# Patient Record
Sex: Male | Born: 1985 | Race: White | Hispanic: No | Marital: Married | Smoking: Never smoker
Health system: Southern US, Community
[De-identification: ages and names within clinical notes are randomized; demographics above are authoritative.]

## PROBLEM LIST (undated history)

## (undated) DIAGNOSIS — G4730 Sleep apnea, unspecified: Secondary | ICD-10-CM

## (undated) DIAGNOSIS — F419 Anxiety disorder, unspecified: Secondary | ICD-10-CM

## (undated) DIAGNOSIS — I10 Essential (primary) hypertension: Secondary | ICD-10-CM

## (undated) HISTORY — DX: Sleep apnea, unspecified: G47.30

## (undated) HISTORY — DX: Anxiety disorder, unspecified: F41.9

## (undated) HISTORY — DX: Essential (primary) hypertension: I10

---
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
## 2008-07-19 HISTORY — PX: HERNIA REPAIR: SHX51

## 2009-07-24 ENCOUNTER — Ambulatory Visit: Payer: Self-pay | Admitting: Neurology

## 2010-01-08 ENCOUNTER — Emergency Department: Payer: Self-pay | Admitting: Emergency Medicine

## 2012-07-19 HISTORY — PX: UVULOPALATOPHARYNGOPLASTY: SHX827

## 2013-11-30 ENCOUNTER — Ambulatory Visit: Payer: Self-pay | Admitting: Otolaryngology

## 2013-12-11 ENCOUNTER — Ambulatory Visit: Payer: Self-pay | Admitting: Otolaryngology

## 2014-04-01 ENCOUNTER — Ambulatory Visit: Payer: Self-pay | Admitting: Anesthesiology

## 2014-04-01 DIAGNOSIS — Z01810 Encounter for preprocedural cardiovascular examination: Secondary | ICD-10-CM

## 2014-04-01 DIAGNOSIS — I059 Rheumatic mitral valve disease, unspecified: Secondary | ICD-10-CM

## 2014-04-04 ENCOUNTER — Ambulatory Visit: Payer: Self-pay | Admitting: Otolaryngology

## 2014-04-07 LAB — PATHOLOGY REPORT

## 2014-06-10 ENCOUNTER — Ambulatory Visit: Payer: Self-pay | Admitting: Otolaryngology

## 2014-11-09 NOTE — Op Note (Signed)
PATIENT NAME:  Caleb Jacobson, Caleb Jacobson MR#:  [id number] DATE OF BIRTH:  October 15, 1985  DATE OF PROCEDURE:  04/04/2014  PREOPERATIVE DIAGNOSES: 1.  Obstructive sleep apnea.  2.  Airway obstruction.  3.  Septal deviation.  4.  Turbinate hypertrophy.  5.  Tonsil hypertrophy. 6.  Elongated soft palate and uvula.   POSTOPERATIVE DIAGNOSES: 1.  Obstructive sleep apnea.  2.  Airway obstruction.  3.  Septal deviation.  4.  Turbinate hypertrophy.  5.  Tonsil hypertrophy. 6.  Elongated soft palate and uvula.   OPERATIVE PROCEDURES: 1.  Septoplasty.  2.  Partial reduction of the inferior turbinates.  3.  Uvulopalatopharyngoplasty. 4.  Tonsillectomy.   SURGEON:  Cammy CopaPaul H. Sharbel Sahagun, MD   ANESTHESIA: General.   COMPLICATIONS: None.   TOTAL ESTIMATED BLOOD LOSS:  150 mL.   DESCRIPTION OF PROCEDURE:  The patient was brought to the operating suite and placed in the spine position. He was given general anesthesia by oral endotracheal intubation. The nose was prepped first using 4% Xylocaine mixed with phenylephrine on cotton pledgets in both sides of the nose. The septum was injected with 1% Xylocaine with epinephrine 1:100,000 infiltration in both sides plus the inferior turbinates. He was prepped and draped in sterile fashion.   A left Killian incision was created with elevation of the mucoperichondrium on both sides of the quadrangular plate. There was overhanging cartilage on the left side and some areas where it probably has been fractured that were buckled out. The bony cartilaginous junction was split, and the mucoperiosteum on both sides of the ethmoid and vomer were elevated. The crooked vomer and ethmoid plate areas were trimmed and remaining septum was placed back in the anatomic position. Posteriorly, it was way off to the left side. It was pushed into the middle. There was a small tear inferiorly and posteriorly on the left side. The inferior turbinates, especially on the right side, had to be trimmed  along their anterior-inferior border. Electrocautery was used along the inferior border as well. The remaining portion of the turbinate was outfractured to provide a better airway. The mucosal flaps were then placed back in their anatomic position in the septum, and a 4-0 plain gut suture was used in a through-and-through whipstitch fashion for closure of the nasal septum. This closed the Killian incision as well. Xomed 0.5 mm regular size nasal splints were then trimmed and placed on both sides of the nasal septum; they were held in place with a 3-0 nylon through-and-through suture.   A Davis mouth gag was then used to visualize the oropharynx. The tonsils were markedly enlarged on both sides. The right tonsil was grasped first and pulled medially, the anterior pillar incised. It was dissected to fossa using sharp and blunt dissection. There was plenty of bleeding in the tonsillar fossa that was controlled with electrocautery. The left side tonsil was removed in a similar fashion using electrocautery to control the bleeding.   The UPPP was then done by incising the palate where the posterior tonsillar pillar meets the palate. This was done in a vertical fashion to shorten the palate and pull the tissues laterally. Once this flap was created, a suture was used with 3-0 Vicryl to pull the posterior tonsillar pillar forward and suture it to the anterior tonsillar pillar. This was done on both sides to close the upper portion of the tonsillar fossa and pull the palate forward. The uvula was then trimmed to about one-half of its normal length, and some of it  was trimmed laterally as well. A suture was placed laterally on both sides to help bring the nasopharyngeal mucosa of the palate to the oropharyngeal mucosa. This completed the uvulopalatoplasty with multiple interrupted sutures on both sides.   The patient tolerated the procedure well. Total estimated blood loss was 150 mL. He was awakened and taken to the  recovery room in satisfactory condition. There were no operative complications.    ____________________________ Cammy Copa, MD phj:nb D: 04/04/2014 22:29:48 ET T: 04/04/2014 23:19:14 ET JOB#: [id number]  cc: Cammy Copa, MD, <Dictator> Cammy Copa MD ELECTRONICALLY SIGNED 04/07/2014 9:52

## 2015-01-08 ENCOUNTER — Ambulatory Visit (INDEPENDENT_AMBULATORY_CARE_PROVIDER_SITE_OTHER): Payer: BLUE CROSS/BLUE SHIELD | Admitting: Podiatry

## 2015-01-08 ENCOUNTER — Encounter: Payer: Self-pay | Admitting: Podiatry

## 2015-01-08 ENCOUNTER — Ambulatory Visit (INDEPENDENT_AMBULATORY_CARE_PROVIDER_SITE_OTHER): Payer: BLUE CROSS/BLUE SHIELD

## 2015-01-08 VITALS — Ht 71.0 in | Wt 260.0 lb

## 2015-01-08 DIAGNOSIS — B079 Viral wart, unspecified: Secondary | ICD-10-CM

## 2015-01-08 DIAGNOSIS — M79671 Pain in right foot: Secondary | ICD-10-CM

## 2015-01-08 MED ORDER — FLUOROURACIL 5 % EX CREA: TOPICAL_CREAM | Freq: Two times a day (BID) | CUTANEOUS | Status: DC

## 2015-01-08 NOTE — Progress Notes (Signed)
   Subjective:    Patient ID: Caleb Jacobson, male    DOB: February 16, 1986, 29 y.o.   MRN: [id number] I have a place on my toe it just showed up one day.  Pt has try to cut, shave it off but it has came back. He has no pain , just wants to find out about what it is and take care of it. It looks to be a bunion on the -   1st toe right foot medial side .   HPI    Review of Systems  Constitutional: Positive for appetite change and fatigue.  Respiratory: Positive for apnea.   All other systems reviewed and are negative.      Objective:   Physical Exam: I have reviewed his past medical history medications allergies surgery social history and review of systems. Pulses are strongly palpable bilaterally. Neurologic sensorium is intact per Semmes-Weinstein monofilament. Deep tendon reflexes are intact bilateral. Muscle strength +5 over 5 dorsiflexion plantar flexors and inverters everters all intrinsic musculature is intact. Orthopedic evaluation demonstrates all joints distal to the ankle range of motion without crepitation. Cutaneous evaluation demonstrates supple well-hydrated uterus with a exception of the medial aspect of the IP joint of the hallux right. There is soft lesion measuring less than a centimeter in diameter with thrombosed capillaries centralized. This does appear to be verrucoid in nature. Skin lines do circumvent the lesion. There is 1 small satellite lesion located plantarly.        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  Plan: Chemical distraction of the lesion today with application of Efudex cream to be started within the next couple of days. He will continue with Efudex cream for the next 6 weeks I will follow-up with him at that time. We will consider surgical excision if this does not resolve.

## 2015-02-19 ENCOUNTER — Ambulatory Visit (INDEPENDENT_AMBULATORY_CARE_PROVIDER_SITE_OTHER): Payer: BLUE CROSS/BLUE SHIELD | Admitting: Podiatry

## 2015-02-19 ENCOUNTER — Encounter: Payer: Self-pay | Admitting: Podiatry

## 2015-02-19 DIAGNOSIS — L309 Dermatitis, unspecified: Secondary | ICD-10-CM | POA: Diagnosis not present

## 2015-02-19 DIAGNOSIS — B070 Plantar wart: Secondary | ICD-10-CM

## 2015-02-19 DIAGNOSIS — B079 Viral wart, unspecified: Secondary | ICD-10-CM

## 2015-02-19 MED ORDER — TRIAMCINOLONE ACETONIDE 0.1 % EX CREA: 1.0000 "application " | TOPICAL_CREAM | Freq: Two times a day (BID) | CUTANEOUS | Status: DC

## 2015-02-19 NOTE — Progress Notes (Signed)
He presents today for follow-up of verruca plantaris and medial aspect of the IP joint of the hallux right. He states that it looks like he has decreased thickness him. He continues to use his Efudex cream on a daily basis. He also states that he thinks he got into some poison ivy or pillow is in [place]. He states it seems to be resolving to some degree.  Objective: Vital signs are stable he is alert and oriented 3. Pulses are palpable right. Verruca appears to be resolving. I debrided the verrucoid lesion today to bleeding thrombosed capillaries are visible but are much more superficial. 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  Assessment: Peripheral plantaris and poison ivy.  Plan: Applied acid to the verruca today under occlusion this is to be left on until morning and then washed off thoroughly. I also wrote a prescription for triamcinolone cream for the poison ivy and also suggested Epsom salts and warm water soaks. I will follow-up with him in 6 weeks if necessary.

## 2015-04-02 ENCOUNTER — Encounter: Payer: Self-pay | Admitting: Podiatry

## 2015-04-02 ENCOUNTER — Ambulatory Visit (INDEPENDENT_AMBULATORY_CARE_PROVIDER_SITE_OTHER): Payer: BLUE CROSS/BLUE SHIELD | Admitting: Podiatry

## 2015-04-02 VITALS — BP 123/86 | HR 91 | Resp 16

## 2015-04-02 DIAGNOSIS — B070 Plantar wart: Secondary | ICD-10-CM

## 2015-04-02 DIAGNOSIS — B079 Viral wart, unspecified: Secondary | ICD-10-CM

## 2015-04-02 NOTE — Progress Notes (Signed)
He presents today 6 weeks status post chemical destruction lesion hallux right. He states that it is seems to be doing much better. He continues to Efudex cream on a daily basis. He states that he recently contracted hand foot and mouth disease from his daughter.  Objective: Vital signs are stable alert and oriented 3. Pulses are strongly palpable right foot. He is still sloughing skin from his hands and his feet. He also demonstrates verruca vulgaris well-healed right.  Assessment: Well-healing wart hallux right.  Plan: Debridement of the area today with chemical destruction with acid under occlusion to be left on for 24 hours. He will wash this off thoroughly. He will continue the use of Efudex cream. He will notify me with any questions or concerns. Otherwise he should continue to treat this until completely resolved.

## 2018-02-23 ENCOUNTER — Other Ambulatory Visit: Payer: Self-pay | Admitting: Internal Medicine

## 2018-02-23 DIAGNOSIS — N5089 Other specified disorders of the male genital organs: Secondary | ICD-10-CM

## 2018-02-28 ENCOUNTER — Ambulatory Visit
Admission: RE | Admit: 2018-02-28 | Discharge: 2018-02-28 | Disposition: A | Discharge status: Home / Self Care | Payer: PRIVATE HEALTH INSURANCE | Source: Ambulatory Visit | Attending: Internal Medicine | Admitting: Internal Medicine

## 2018-02-28 DIAGNOSIS — N5089 Other specified disorders of the male genital organs: Secondary | ICD-10-CM | POA: Insufficient documentation

## 2018-02-28 DIAGNOSIS — N433 Hydrocele, unspecified: Secondary | ICD-10-CM | POA: Diagnosis not present

## 2018-02-28 DIAGNOSIS — I861 Scrotal varices: Secondary | ICD-10-CM | POA: Diagnosis not present
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
## 2018-03-09 ENCOUNTER — Encounter: Payer: Self-pay | Admitting: Urology

## 2018-03-09 ENCOUNTER — Ambulatory Visit: Payer: PRIVATE HEALTH INSURANCE | Admitting: Urology

## 2018-03-09 VITALS — BP 120/76 | HR 116 | Ht 71.0 in | Wt 270.0 lb

## 2018-03-09 DIAGNOSIS — I861 Scrotal varices: Secondary | ICD-10-CM | POA: Diagnosis not present

## 2018-03-09 DIAGNOSIS — N433 Hydrocele, unspecified: Secondary | ICD-10-CM

## 2018-06-12 ENCOUNTER — Ambulatory Visit: Payer: PRIVATE HEALTH INSURANCE | Admitting: Urology

## 2019-05-11 ENCOUNTER — Other Ambulatory Visit: Payer: Self-pay

## 2019-05-11 DIAGNOSIS — Z20822 Contact with and (suspected) exposure to covid-19: Secondary | ICD-10-CM

## 2019-05-13 LAB — NOVEL CORONAVIRUS, NAA: SARS-CoV-2, NAA: NOT DETECTED

## 2019-12-18 IMAGING — US US SCROTUM W/ DOPPLER COMPLETE
1 series · 14 of 25 positions shown · non-contrast
Comparison: None.

CLINICAL DATA: Right testicular swelling. Right-sided lump for 4
weeks.

EXAM:
SCROTAL ULTRASOUND
DOPPLER ULTRASOUND OF THE TESTICLES
TECHNIQUE: Complete ultrasound examination of the testicles, epididymis, and
other scrotal structures was performed. Color and spectral Doppler
ultrasound were also utilized to evaluate blood flow to the
testicles.

[Series 1: us scrotum w/ doppler complete · 0.08mm/px · 14 of 71 slices shown]
[im 1/71]
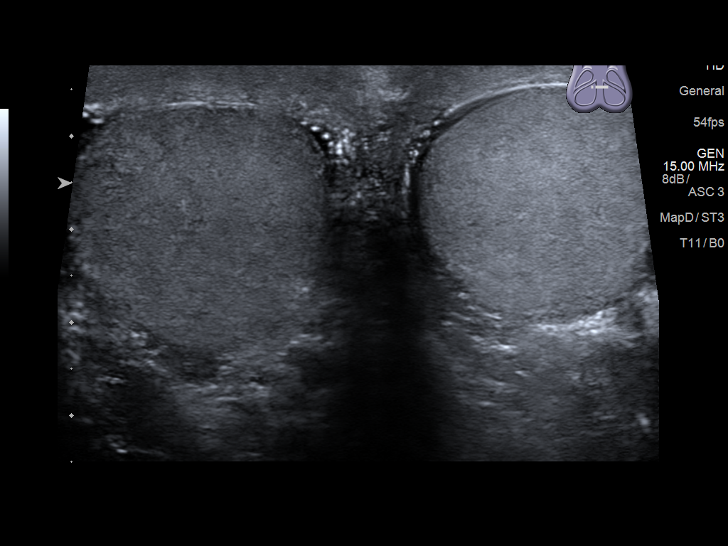
[im 6/71]
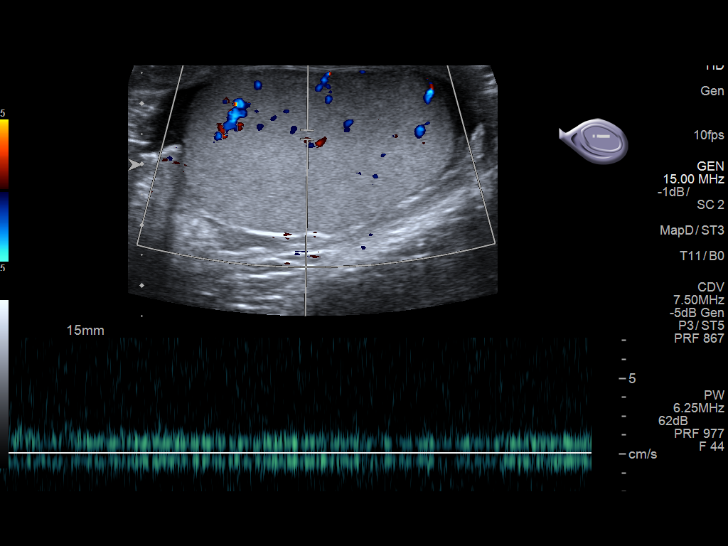
[im 12/71]
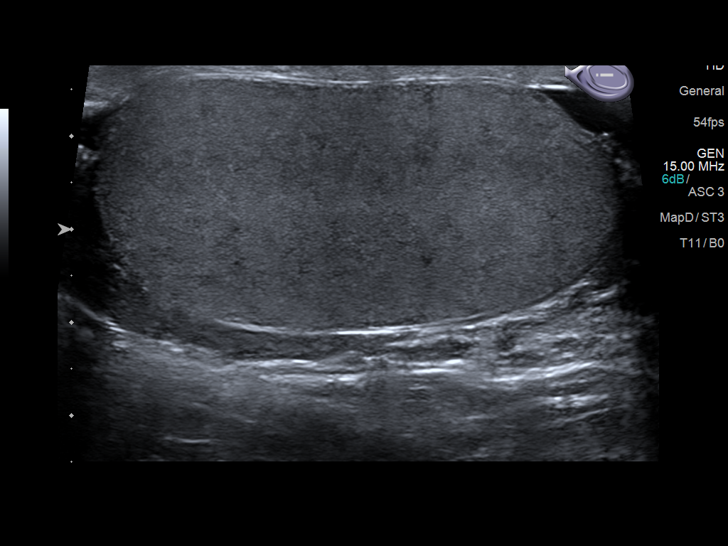
[im 18/71]
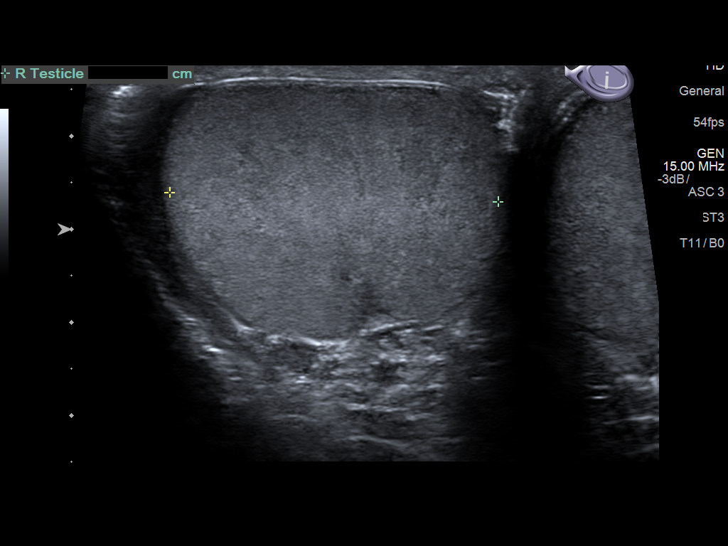
[im 24/71]
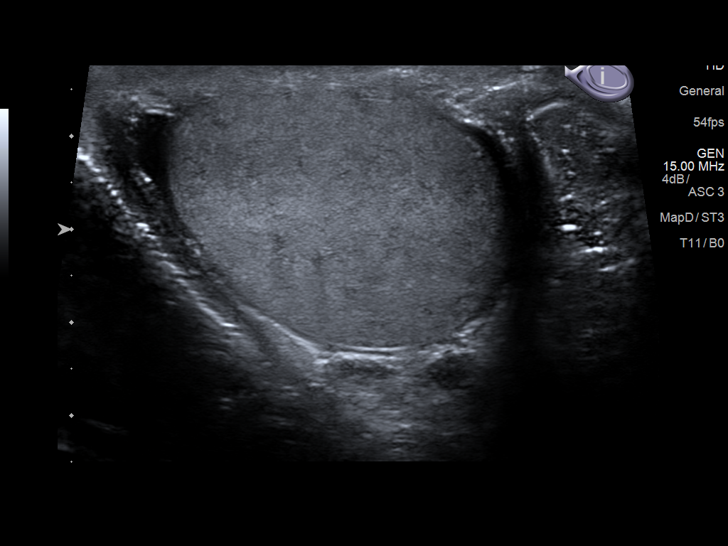
[im 27/71]
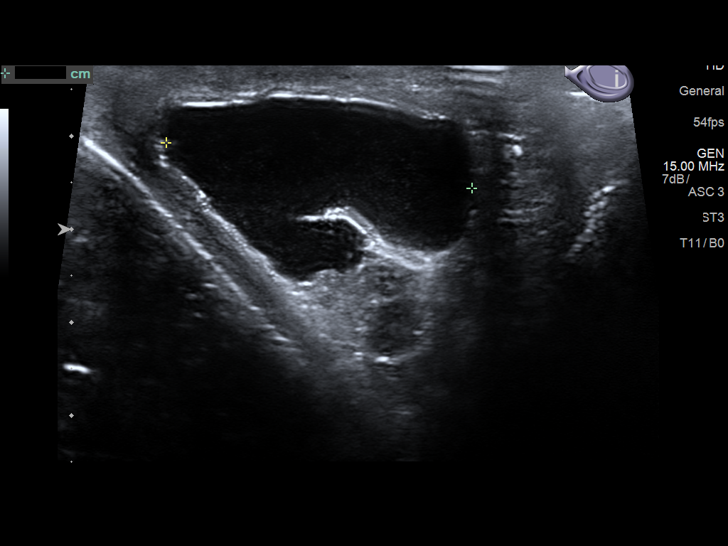
[im 33/71]
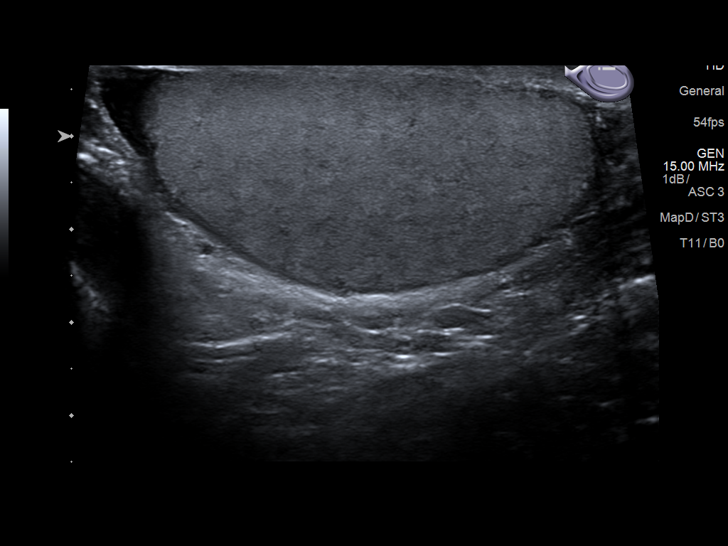
[im 38/71]
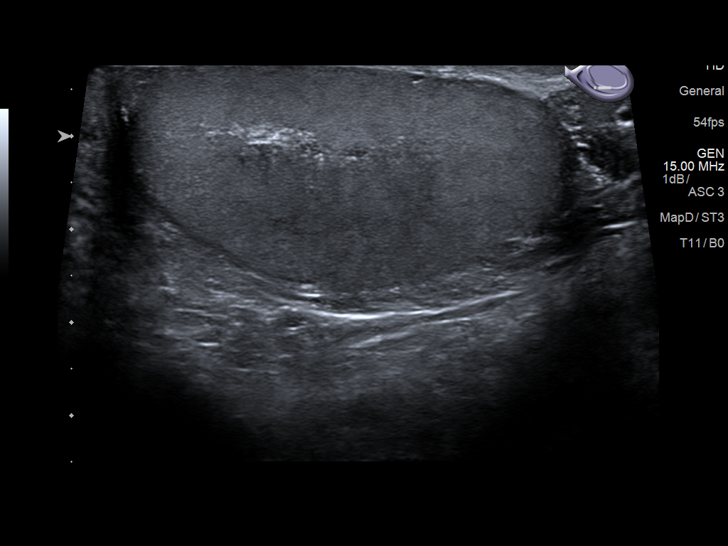
[im 44/71]
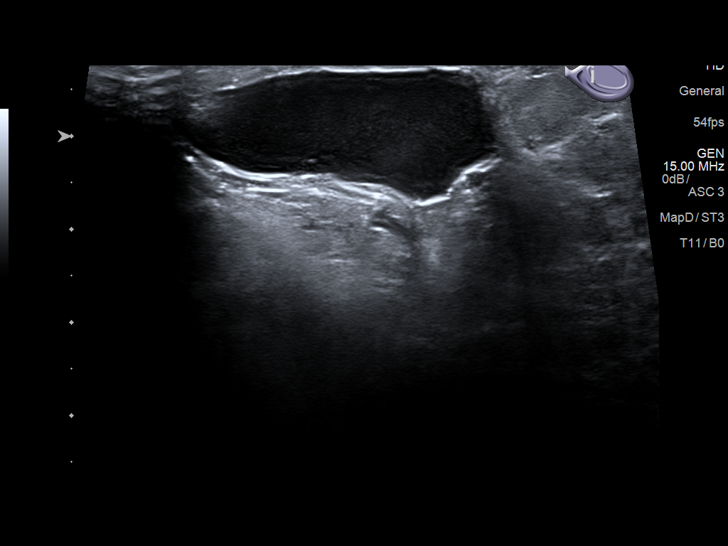
[im 47/71]
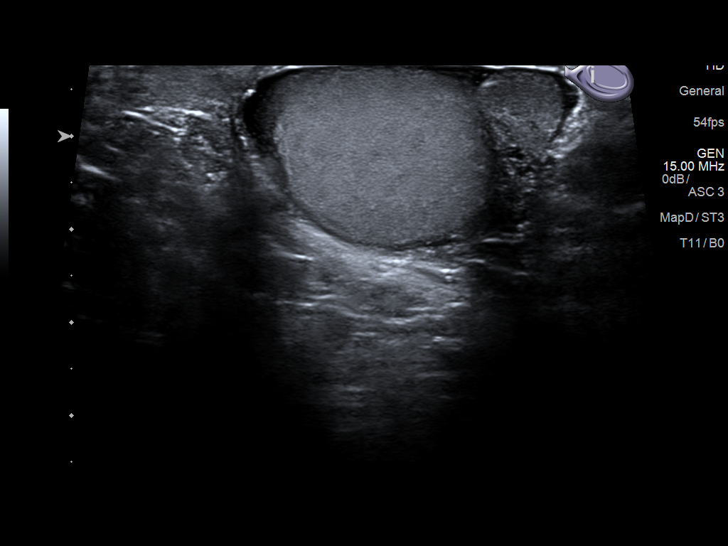
[im 53/71]
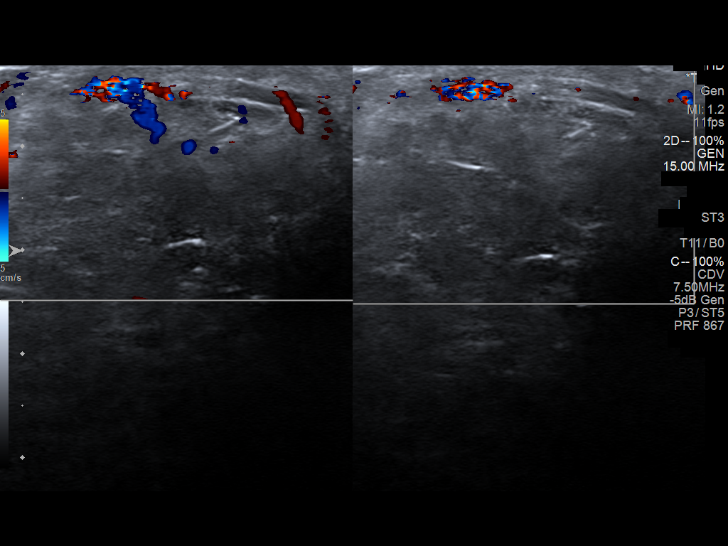
[im 59/71]
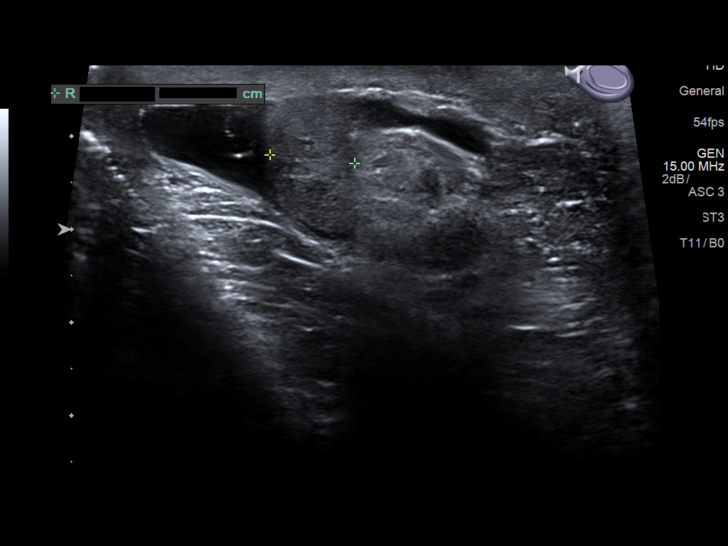
[im 65/71]
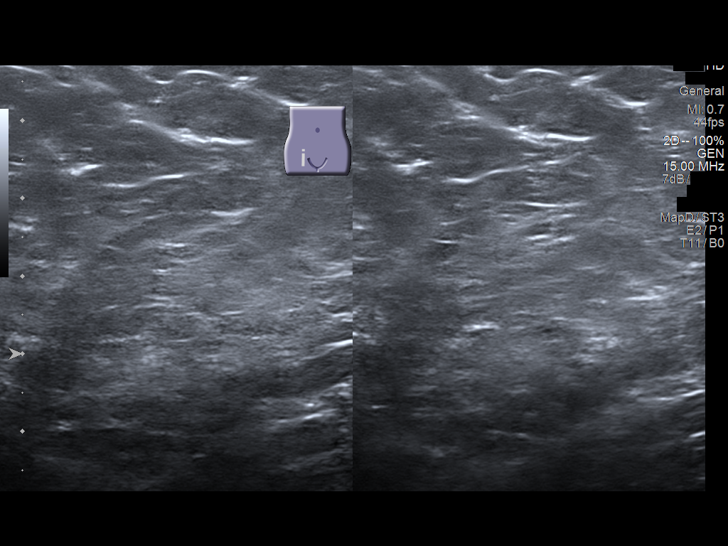
[im 71/71]
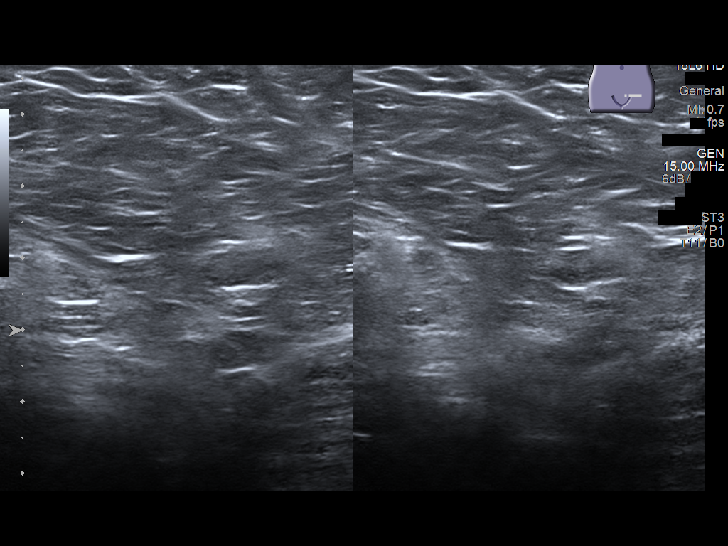

[14 of 25 positions shown; findings below may reference images not displayed]

FINDINGS: Right testicle

Measurements: 5.1 x 2.8 x 3.2 cm. No mass or microlithiasis
visualized.

Left testicle

Measurements: 5.1 x 2.8 x 3.2 cm. No mass or microlithiasis
visualized.

Right epididymis: Normal in size and appearance. There may be a
developing scrotal pearl.

Left epididymis:  Normal in size and appearance.

Hydrocele:  Large bilateral hydroceles, right greater than left.

Varicocele:  Present on the left.

Pulsed Doppler interrogation of both testes demonstrates normal low
resistance arterial and venous waveforms bilaterally.
IMPRESSION: 1. No evidence of testicular torsion or mass.
2. Large bilateral hydroceles, right greater left.
3. Left varicocele.

## 2022-09-10 ENCOUNTER — Encounter: Payer: Self-pay | Admitting: Cardiovascular Disease

## 2022-09-10 ENCOUNTER — Ambulatory Visit (INDEPENDENT_AMBULATORY_CARE_PROVIDER_SITE_OTHER): Payer: BC Managed Care – PPO | Admitting: Cardiovascular Disease

## 2022-09-10 VITALS — BP 132/90 | HR 84 | Ht 71.0 in | Wt 261.0 lb

## 2022-09-10 DIAGNOSIS — I4711 Inappropriate sinus tachycardia, so stated: Secondary | ICD-10-CM

## 2022-09-10 DIAGNOSIS — R0602 Shortness of breath: Secondary | ICD-10-CM | POA: Diagnosis not present

## 2022-09-10 DIAGNOSIS — I340 Nonrheumatic mitral (valve) insufficiency: Secondary | ICD-10-CM

## 2022-09-10 NOTE — Progress Notes (Signed)
Cardiology Office Note   Date:  09/10/2022   ID:  Caleb Jacobson, DOB [birth year], MRN [id number]  PCP:  Jodi Marble, MD  Cardiologist:  Neoma Laming, MD      History of Present Illness: Caleb Jacobson is a 37 y.o. male who presents for  Chief Complaint  Patient presents with   Follow-up    3 Months Follow Up    Shortness of Breath This is a chronic problem. The current episode started more than 1 year ago. The problem has been resolved. The symptoms are aggravated by any activity.      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     HISTORY OF PRESENT ILLNESS  Caleb Jacobson is a 37 year old male.   Has HTN, and sinus tachycardia, SOB with exertion.  Allergy list reviewed   Problem list reviewed   Medication list reviewed    Chest pain or discomfort   Palpitations   Fast heart rate    Dyspnea    Not feeling congested in the chest     CURRENT MEDICATION    Baclofen 10 MG Oral Tablet twice a day, 30 days, 0 refills    busPIRone HCl 15 MG Oral Tablet twice a day, 30 days, 2 refills    Chlorthalidone 25 MG Oral Tablet 1 every morning, 90 days, 0 refills    Valium 5 MG Oral Tablet three times a day, 15 days, 2 refills     PAST MEDICAL/SURGICAL HISTORY  Reported:  Medications:  Taking OTC pain medication. Tests: Blood pressure was checked and an ECG was performed sinus tachycardia 115/min icrbb, lae. Surgical:   Hernia repair  Back surgery  Lumbar disc surgery  2002     SOCIAL HISTORY  Personal:  Recent emotional stress. Tobacco use: Not a current smoker and smoking status: Never smoker. Alcohol: Alcohol.  Not using alcohol. Drug Use: Not using drugs. Habits: Good exercise habits. Work: Occupation Astronomer.     ALLERGIES    No Known Allergies     FAMILY HISTORY  Heart disease breast cancer in family  Systemic hypertension     REVIEW OF SYSTEMS  Systemic: Not feeling poorly (malaise).  No recent weight change. Head: No headache and no sinus pain. Cardiovascular: No chest pain or discomfort, no palpitations, and the heart rate was not fast. Pulmonary: No dyspnea, no cough, and no wheezing. Gastrointestinal: No heartburn.  No nausea, no vomiting, no abdominal pain, and no diarrhea. Neurological: No dizziness, no vertigo, and no fainting. Psychological: No anxiety and no depression.     PHYSICAL FINDINGS    Vitals taken 12/04/2020 02:11 pm  BP-Sitting L  124/76 mmHg 100 - 120/60 - 80  Pulse Rate-Sitting  120 bpm 50 - 100  Temp-Tympanic  98.7 F 96 - 101  Height  71 in 65 - 75  Weight  300 lbs  6.4 oz 125 - 225  Body Mass Index  41.9 kg/m2   Body Surface Area  2.51 m2   Oxygen Saturation  96 % 93 - 100    General Appearance:  In no acute distress. Lungs:  Clear to auscultation. Cardiovascular: Jugular Venous Distention:  JVD not increased.   JVD not increased. Heart Rate And Rhythm:  Normal.   Normal. Heart Sounds:  Normal. Murmurs:  No murmurs were heard. Carotid Arteries:  No bruit in the carotid artery. Arterial Pulses:  Equal bilaterally and normal. Edema:  Not present. Abdomen: Auscultation:  Bowel sounds were normal. Palpation:  Abdominal non-tender. Neurological:  Oriented to time, place, and person.     ASSESSMENT   Shortness of breath  Abnormal electrocardiogram  Sinus tachycardia  Cardiomyopathy  Systemic hypertension  Hyperlipidemia  Type 2 diabetes mellitus  Type 1 diabetes  mellitus     THERAPY   Continue current medication.     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 Will do stress test to see how fast and holter.     HEALTH REMINDERS    Assess BMI satisfied 12/04/2020.    Assess Tobacco Use satisfied 12/04/2020.      SNeoma LamingMD  Electronically signed by: SNeoma Laming    Date: 12/04/2020 14:26 Current Outpatient Medications  Medication Sig Dispense Refill   buPROPion (WELLBUTRIN XL) 150 MG 24 hr tablet Take 150 mg by mouth daily.     buPROPion (WELLBUTRIN XL) 300 MG 24 hr tablet Take 300 mg by mouth daily.     losartan (COZAAR) 25 MG tablet Take 25 mg by mouth daily.     metoprolol succinate (TOPROL-XL) 50 MG 24 hr tablet Take 50 mg by mouth daily.     OZEMPIC, 1 MG/DOSE, 4 MG/3ML SOPN Inject 1 mg into the skin once a week.     valACYclovir (VALTREX) 1000 MG tablet Take 1,000 mg by mouth 3 (three) times daily.     triamcinolone cream (KENALOG) 0.1 % Apply 1 application topically 2 (two) times daily. 30 g 2   No current facility-administered medications for this visit.    Allergies:   Patient has no known allergies.    Social History:   reports that he has never smoked. He has never used smokeless tobacco. He reports current alcohol use. He reports that he does not use drugs.   Family History:  family history is not on file.    ROS:     Review of Systems  Respiratory:  Positive for shortness of breath.       All other systems are reviewed and negative.     PHYSICAL EXAM: VS:  BP (!) 132/90   Pulse 84   Ht '5\' 11"'$  (1.803 m)   Wt 261 lb (118.4 kg)   SpO2 98%   BMI 36.40 kg/m  , BMI Body mass index is 36.4 kg/m. Last weight:  Wt Readings from Last 3 Encounters:  09/10/22 261 lb (118.4 kg)  03/09/18 270 lb (122.5 kg)  01/08/15 260 lb (  117.9 kg)     Physical Exam    EKG:   Recent Labs: No results found for requested labs within last 365 days.    Lipid Panel No results found for: "CHOL", "TRIG", "HDL", "CHOLHDL", "VLDL", "LDLCALC", "LDLDIRECT"    REASON FOR VISIT  Visit for: Echocardiogram/I 47.1  Sex:   Male         wt= 277   lbs.  BP=124/74  Height=71    inches.        TESTS  Imaging: Echocardiogram:  An echocardiogram in (2-d) mode was performed and in Doppler mode with color flow velocity mapping was performed. The aortic valve cusps are abnormal 2.2   cm, flow velocity .99991111  m/s, and systolic calculated mean flow gradient 1   mmHg. Mitral valve diastolic peak flow velocity E .415    m/s and E/A ratio 0.6. Aortic root diameter 3.2  cm. The LVOT internal diameter 3.5  cm and flow velocity was abnormal .707   m/s. LV systolic dimension [id number]   cm, diastolic 4.5  cm, posterior wall thickness 1.29  cm, fractional shortening 40.2 %, and EF 71  %. IVS thickness 1.63  cm. LA dimension 4.4 cm. Mitral Valve has Trace Regurgitation. Tricuspid Valve has Trace Regurgitation.     ASSESSMENT  Technically adequate study.  Normal chamber sizes.  Normal left ventricular systolic function.  Mild left ventricular hypertrophy with GRADE 1 (relaxation abnormality) diastolic dysfunction.  Normal right ventricular systolic function.  Normal right ventricular diastolic function.  Normal left ventricular wall motion.  Normal right ventricular wall motion.  Trace tricuspid regurgitation.  Mild pulmonary hypertension.  Trace mitral regurgitation.  No pericardial effusion.  Mild LVH.     THERAPY   Referring physician:  Dionisio David  Sonographer: Neoma Laming.      Neoma Laming MD  Electronically signed by: Neoma Laming     Date: 01/26/2022 09:06 Other studies Reviewed: Additional studies/ records that were reviewed today include:  Review of the above records demonstrates:       No data to display            ASSESSMENT AND PLAN:    ICD-10-CM   1. SOB (shortness of breath)  R06.02 PCV ECHOCARDIOGRAM COMPLETE    2. Nonrheumatic mitral valve regurgitation  I34.0 PCV ECHOCARDIOGRAM COMPLETE    3. Inappropriate sinus tachycardia  I47.11 PCV ECHOCARDIOGRAM COMPLETE   Lost 60 pounds, feeling better, continue metoprolol. Will check ECHO.       Problem List Items Addressed This Visit   None Visit Diagnoses     SOB (shortness of breath)    -  Primary   Relevant Orders   PCV ECHOCARDIOGRAM COMPLETE   Nonrheumatic mitral valve regurgitation       Relevant Medications   losartan (COZAAR) 25 MG tablet   metoprolol succinate (TOPROL-XL) 50 MG 24 hr tablet   Other Relevant Orders   PCV ECHOCARDIOGRAM COMPLETE   Inappropriate sinus tachycardia       Lost 60 pounds, feeling better, continue metoprolol. Will check ECHO.   Relevant Medications   losartan (COZAAR) 25 MG tablet   metoprolol succinate (TOPROL-XL) 50 MG 24 hr tablet   Other Relevant Orders   PCV ECHOCARDIOGRAM COMPLETE          Disposition:   Return in about 3 months (around 12/09/2022) for echo and f/u.    Total time spent: 30 minutes  Signed,  Neoma Laming, MD  09/10/2022 9:35 AM  [place]

## 2022-09-24 ENCOUNTER — Ambulatory Visit: Payer: BC Managed Care – PPO | Admitting: Internal Medicine

## 2022-09-27 ENCOUNTER — Other Ambulatory Visit: Payer: Self-pay | Admitting: Cardiovascular Disease

## 2022-09-27 DIAGNOSIS — I10 Essential (primary) hypertension: Secondary | ICD-10-CM

## 2022-12-02 ENCOUNTER — Ambulatory Visit (INDEPENDENT_AMBULATORY_CARE_PROVIDER_SITE_OTHER): Payer: BC Managed Care – PPO

## 2022-12-02 DIAGNOSIS — I340 Nonrheumatic mitral (valve) insufficiency: Secondary | ICD-10-CM | POA: Diagnosis not present

## 2022-12-02 DIAGNOSIS — I4711 Inappropriate sinus tachycardia, so stated: Secondary | ICD-10-CM

## 2022-12-02 DIAGNOSIS — R0602 Shortness of breath: Secondary | ICD-10-CM

## 2022-12-09 ENCOUNTER — Encounter: Payer: Self-pay | Admitting: Cardiovascular Disease

## 2022-12-09 ENCOUNTER — Ambulatory Visit (INDEPENDENT_AMBULATORY_CARE_PROVIDER_SITE_OTHER): Payer: BC Managed Care – PPO | Admitting: Cardiovascular Disease

## 2022-12-09 VITALS — BP 130/82 | HR 87 | Ht 71.0 in | Wt 270.0 lb

## 2022-12-09 DIAGNOSIS — I4711 Inappropriate sinus tachycardia, so stated: Secondary | ICD-10-CM | POA: Diagnosis not present

## 2022-12-09 MED ORDER — METOPROLOL SUCCINATE ER 50 MG PO TB24
50.0000 mg | ORAL_TABLET | Freq: Every day | ORAL | 3 refills | Status: DC
Start: 2022-12-09 — End: 2023-04-14

## 2022-12-09 NOTE — Progress Notes (Signed)
Cardiology Office Note   Date:  12/09/2022   ID:  Caleb Jacobson, DOB 03/02/1986, MRN [id number]  PCP:  Sherron Monday, MD  Cardiologist:  Adrian Blackwater, MD      History of Present Illness: Caleb Jacobson is a 37 y.o. male who presents for  Chief Complaint  Patient presents with   Follow-up    3 months follow up & ECHO Results    Patient in office to discuss results of echo. Feeling well.    Past Medical History:  Diagnosis Date   Anxiety    Hypertension    Sleep apnea      Past Surgical History:  Procedure Laterality Date   HERNIA REPAIR  2010   REPLACEMENT [facility] ANTERIOR LUMBAR SPINE  2003   UVULOPALATOPHARYNGOPLASTY  2014     Current Outpatient Medications  Medication Sig Dispense Refill   buPROPion (WELLBUTRIN XL) 300 MG 24 hr tablet Take 300 mg by mouth daily.     losartan (COZAAR) 25 MG tablet TAKE 1 TABLET BY MOUTH EVERY DAY 90 tablet 0   valACYclovir (VALTREX) 1000 MG tablet Take 1,000 mg by mouth 3 (three) times daily.     metoprolol succinate (TOPROL-XL) 50 MG 24 hr tablet Take 1 tablet (50 mg total) by mouth daily. 30 tablet 3   No current facility-administered medications for this visit.    Allergies:   Patient has no known allergies.    Social History:   reports that he has never smoked. He has never used smokeless tobacco. He reports current alcohol use. He reports that he does not use drugs.   Family History:  family history is not on file.    ROS:     Review of Systems  Constitutional: Negative.   HENT: Negative.    Eyes: Negative.   Respiratory: Negative.    Cardiovascular: Negative.   Gastrointestinal: Negative.   Genitourinary: Negative.   Musculoskeletal: Negative.   Skin: Negative.   Neurological: Negative.   Endo/Heme/Allergies: Negative.   Psychiatric/Behavioral: Negative.    All other systems reviewed and are negative.    All other systems are reviewed and negative.    PHYSICAL EXAM: VS:  BP 130/82   Pulse 87    Ht 5\' 11"  (1.803 m)   Wt 270 lb (122.5 kg)   SpO2 96%   BMI 37.66 kg/m  , BMI Body mass index is 37.66 kg/m. Last weight:  Wt Readings from Last 3 Encounters:  12/09/22 270 lb (122.5 kg)  09/10/22 261 lb (118.4 kg)  03/09/18 270 lb (122.5 kg)    Physical Exam Vitals reviewed.  Constitutional:      Appearance: Normal appearance. He is normal weight.  HENT:     Head: Normocephalic.     Nose: Nose normal.     Mouth/Throat:     Mouth: Mucous membranes are moist.  Eyes:     Pupils: Pupils are equal, round, and reactive to light.  Cardiovascular:     Rate and Rhythm: Normal rate and regular rhythm.     Pulses: Normal pulses.     Heart sounds: Normal heart sounds.  Pulmonary:     Effort: Pulmonary effort is normal.  Abdominal:     General: Abdomen is flat. Bowel sounds are normal.  Musculoskeletal:        General: Normal range of motion.     Cervical back: Normal range of motion.  Skin:    General: Skin is warm.  Neurological:  General: No focal deficit present.     Mental Status: He is alert.  Psychiatric:        Mood and Affect: Mood normal.      EKG: none today  Recent Labs: No results found for requested labs within last 365 days.    Lipid Panel No results found for: "CHOL", "TRIG", "HDL", "CHOLHDL", "VLDL", "LDLCALC", "LDLDIRECT"    Other studies Reviewed: echo 11/2022   ASSESSMENT AND PLAN:    ICD-10-CM   1. Inappropriate sinus tachycardia  I47.11 metoprolol succinate (TOPROL-XL) 50 MG 24 hr tablet       Problem List Items Addressed This Visit       Cardiovascular and Mediastinum   Inappropriate sinus tachycardia - Primary    Patient reports feeling well. Recent echo normal EF, grade I DD. HR elevated, recommend increasing metoprolol. Patient states he is taking 25 mg daily, increase to 50 mg daily.       Relevant Medications   metoprolol succinate (TOPROL-XL) 50 MG 24 hr tablet     Disposition:   Return in about 4 months (around  04/11/2023).    Total time spent: 30 minutes  Signed,  Adrian Blackwater, MD  12/09/2022 10:11 AM    [facility]

## 2022-12-09 NOTE — Assessment & Plan Note (Signed)
Patient reports feeling well. Recent echo normal EF, grade I DD. HR elevated, recommend increasing metoprolol. Patient states he is taking 25 mg daily, increase to 50 mg daily.

## 2022-12-09 NOTE — Patient Instructions (Signed)
Check dose of metoprolol. If taking 25 mg, go to 50 mg. If taking 50 mg, let us know.

## 2023-04-13 ENCOUNTER — Other Ambulatory Visit: Payer: Self-pay | Admitting: Cardiovascular Disease

## 2023-04-13 DIAGNOSIS — I4711 Inappropriate sinus tachycardia, so stated: Secondary | ICD-10-CM

## 2023-04-14 ENCOUNTER — Encounter: Payer: Self-pay | Admitting: Cardiovascular Disease

## 2023-04-14 ENCOUNTER — Ambulatory Visit (INDEPENDENT_AMBULATORY_CARE_PROVIDER_SITE_OTHER): Payer: BC Managed Care – PPO | Admitting: Cardiovascular Disease

## 2023-04-14 VITALS — BP 140/95 | HR 107 | Ht 71.0 in | Wt 304.8 lb

## 2023-04-14 DIAGNOSIS — I4711 Inappropriate sinus tachycardia, so stated: Secondary | ICD-10-CM | POA: Diagnosis not present

## 2023-04-14 DIAGNOSIS — I340 Nonrheumatic mitral (valve) insufficiency: Secondary | ICD-10-CM

## 2023-04-14 DIAGNOSIS — I10 Essential (primary) hypertension: Secondary | ICD-10-CM | POA: Diagnosis not present

## 2023-04-14 DIAGNOSIS — R0602 Shortness of breath: Secondary | ICD-10-CM

## 2023-04-14 MED ORDER — SPIRONOLACTONE 25 MG PO TABS: 25.0000 mg | ORAL_TABLET | Freq: Every day | ORAL | 11 refills | Status: DC

## 2023-04-14 NOTE — Progress Notes (Addendum)
Cardiology Office Note   Date:  04/14/2023   ID:  Caleb Jacobson, DOB June 30, 1986, MRN [id number]  PCP:  Sherron Monday, MD  Cardiologist:  Adrian Blackwater, MD      History of Present Illness: Caleb Jacobson is a 37 y.o. male who presents for  Chief Complaint  Patient presents with   Follow-up    4 mo    Doing well, BP is high      Past Medical History:  Diagnosis Date   Anxiety    Hypertension    Sleep apnea      Past Surgical History:  Procedure Laterality Date   HERNIA REPAIR  2010   REPLACEMENT [facility] ANTERIOR LUMBAR SPINE  2003   UVULOPALATOPHARYNGOPLASTY  2014     Current Outpatient Medications  Medication Sig Dispense Refill   lamoTRIgine (LAMICTAL) 25 MG tablet Take 25 mg by mouth 2 (two) times daily.     spironolactone (ALDACTONE) 25 MG tablet Take 1 tablet (25 mg total) by mouth daily. 30 tablet 11   buPROPion (WELLBUTRIN XL) 300 MG 24 hr tablet Take 300 mg by mouth daily.     losartan (COZAAR) 25 MG tablet TAKE 1 TABLET BY MOUTH EVERY DAY 90 tablet 0   metoprolol succinate (TOPROL-XL) 50 MG 24 hr tablet Take 1 tablet (50 mg total) by mouth daily. 30 tablet 3   valACYclovir (VALTREX) 1000 MG tablet Take 1,000 mg by mouth 3 (three) times daily.     No current facility-administered medications for this visit.    Allergies:   Patient has no known allergies.    Social History:   reports that he has never smoked. He has never used smokeless tobacco. He reports current alcohol use. He reports that he does not use drugs.   Family History:  family history is not on file.    ROS:     Review of Systems  Constitutional: Negative.   HENT: Negative.    Eyes: Negative.   Respiratory: Negative.    Gastrointestinal: Negative.   Genitourinary: Negative.   Musculoskeletal: Negative.   Skin: Negative.   Neurological: Negative.   Endo/Heme/Allergies: Negative.   Psychiatric/Behavioral: Negative.    All other systems reviewed and are negative.     All  other systems are reviewed and negative.    PHYSICAL EXAM: VS:  BP (!) 140/95   Pulse (!) 107   Ht 5\' 11"  (1.803 m)   Wt (!) 304 lb 12.8 oz (138.3 kg)   SpO2 95%   BMI 42.51 kg/m  , BMI Body mass index is 42.51 kg/m. Last weight:  Wt Readings from Last 3 Encounters:  04/14/23 (!) 304 lb 12.8 oz (138.3 kg)  12/09/22 270 lb (122.5 kg)  09/10/22 261 lb (118.4 kg)     Physical Exam Vitals reviewed.  Constitutional:      Appearance: Normal appearance. He is normal weight.  HENT:     Head: Normocephalic.     Nose: Nose normal.     Mouth/Throat:     Mouth: Mucous membranes are moist.  Eyes:     Pupils: Pupils are equal, round, and reactive to light.  Cardiovascular:     Rate and Rhythm: Normal rate and regular rhythm.     Pulses: Normal pulses.     Heart sounds: Normal heart sounds.  Pulmonary:     Effort: Pulmonary effort is normal.  Abdominal:     General: Abdomen is flat. Bowel sounds are normal.  Musculoskeletal:  General: Normal range of motion.     Cervical back: Normal range of motion.  Skin:    General: Skin is warm.  Neurological:     General: No focal deficit present.     Mental Status: He is alert.  Psychiatric:        Mood and Affect: Mood normal.       EKG: NSR 96/min INCBB  Recent Labs: No results found for requested labs within last 365 days.    Lipid Panel No results found for: "CHOL", "TRIG", "HDL", "CHOLHDL", "VLDL", "LDLCALC", "LDLDIRECT"    Other studies Reviewed: Additional studies/ records that were reviewed today include:  Review of the above records demonstrates:       No data to display            ASSESSMENT AND PLAN:    ICD-10-CM   1. Inappropriate sinus tachycardia  I47.11 spironolactone (ALDACTONE) 25 MG tablet    2. SOB (shortness of breath)  R06.02 spironolactone (ALDACTONE) 25 MG tablet    3. Nonrheumatic mitral valve regurgitation  I34.0 spironolactone (ALDACTONE) 25 MG tablet    4. Primary  hypertension  I10 spironolactone (ALDACTONE) 25 MG tablet   BP high and has diastolic dysfunction, add aldactone       Problem List Items Addressed This Visit       Cardiovascular and Mediastinum   Inappropriate sinus tachycardia - Primary   Relevant Medications   spironolactone (ALDACTONE) 25 MG tablet   Other Visit Diagnoses     SOB (shortness of breath)       Relevant Medications   spironolactone (ALDACTONE) 25 MG tablet   Nonrheumatic mitral valve regurgitation       Relevant Medications   spironolactone (ALDACTONE) 25 MG tablet   Primary hypertension       BP high and has diastolic dysfunction, add aldactone   Relevant Medications   spironolactone (ALDACTONE) 25 MG tablet          Disposition:   Return in about 6 weeks (around 05/26/2023).    Total time spent: 30 minutes  Signed,  Adrian Blackwater, MD  04/14/2023 9:52 AM    [facility]

## 2023-04-18 ENCOUNTER — Encounter: Payer: Self-pay | Admitting: Cardiovascular Disease

## 2023-04-19 ENCOUNTER — Other Ambulatory Visit: Payer: Self-pay | Admitting: Cardiovascular Disease

## 2023-04-19 DIAGNOSIS — I4711 Inappropriate sinus tachycardia, so stated: Secondary | ICD-10-CM

## 2023-04-25 ENCOUNTER — Ambulatory Visit (INDEPENDENT_AMBULATORY_CARE_PROVIDER_SITE_OTHER): Payer: BC Managed Care – PPO | Admitting: Internal Medicine

## 2023-04-25 ENCOUNTER — Encounter: Payer: Self-pay | Admitting: Internal Medicine

## 2023-04-25 ENCOUNTER — Other Ambulatory Visit: Payer: BC Managed Care – PPO

## 2023-04-25 VITALS — BP 130/86 | HR 102 | Ht 71.0 in | Wt 297.8 lb

## 2023-04-25 DIAGNOSIS — K529 Noninfective gastroenteritis and colitis, unspecified: Secondary | ICD-10-CM

## 2023-04-25 DIAGNOSIS — A09 Infectious gastroenteritis and colitis, unspecified: Secondary | ICD-10-CM

## 2023-04-25 MED ORDER — CIPROFLOXACIN HCL 500 MG PO TABS
500.0000 mg | ORAL_TABLET | Freq: Two times a day (BID) | ORAL | 0 refills | Status: AC
Start: 2023-04-25 — End: 2023-05-02

## 2023-04-25 MED ORDER — METRONIDAZOLE 500 MG PO TABS
500.0000 mg | ORAL_TABLET | Freq: Two times a day (BID) | ORAL | 0 refills | Status: AC
Start: 2023-04-25 — End: 2023-05-02

## 2023-04-25 NOTE — Progress Notes (Signed)
Established Patient Office Visit  Subjective:  Patient ID: Caleb Jacobson, male    DOB: 16-Dec-1985  Age: 37 y.o. MRN: [id number]  Chief Complaint  Patient presents with   Acute Visit    Diarrhea x 7 days    Patient comes in with complaints of loose bowel movements for last 1 week.  There is no history of travel or eating unusual foods.  Other family members and close contacts are not having similar complaints.  Patient reports that he started out with having loose bowel movements on the first day but did not have any abdominal pain or cramps.  Also no reports of nausea or vomiting, and no fever or chills.  Since then he is having 3-4 loose, runny stools and even caused incontinence a few times.  Some days they were not so frequent.  However he has managed to maintain a reasonable appetite and is keeping himself hydrated with water and Gatorade. Denies dizziness or headache no back pain and still no abdominal pain or tenderness. There is no blood or mucus in stools.  There is no family history of inflammatory bowel disease. He is careful about his diet now and has actually lost some weight over the last few days.    No other concerns at this time.   Past Medical History:  Diagnosis Date   Anxiety    Hypertension    Sleep apnea     Past Surgical History:  Procedure Laterality Date   HERNIA REPAIR  2010   REPLACEMENT [facility]C ANTERIOR LUMBAR SPINE  2003   UVULOPALATOPHARYNGOPLASTY  2014    Social History   Socioeconomic History   Marital status: Married    Spouse name: Not on file   Number of children: Not on file   Years of education: Not on file   Highest education level: Not on file  Occupational History   Not on file  Tobacco Use   Smoking status: Never   Smokeless tobacco: Never  Substance and Sexual Activity   Alcohol use: Yes    Alcohol/week: 0.0 standard drinks of alcohol   Drug use: Never   Sexual activity: Not on file  Other Topics Concern   Not on file   Social History Narrative   Not on file   Social Determinants of Health   Financial Resource Strain: Not on file  Food Insecurity: Not on file  Transportation Needs: Not on file  Physical Activity: Not on file  Stress: Not on file  Social Connections: Not on file  Intimate Partner Violence: Not on file    History reviewed. No pertinent family history.  No Known Allergies  Review of Systems  Constitutional:  Positive for weight loss. Negative for chills, diaphoresis, fever and malaise/fatigue.  HENT: Negative.  Negative for sore throat.   Eyes: Negative.   Respiratory: Negative.  Negative for cough and shortness of breath.   Cardiovascular: Negative.  Negative for chest pain, palpitations and leg swelling.  Gastrointestinal:  Positive for diarrhea. Negative for abdominal pain, blood in stool, constipation, heartburn, melena, nausea and vomiting.  Genitourinary: Negative.  Negative for dysuria and flank pain.  Musculoskeletal: Negative.  Negative for joint pain and myalgias.  Skin: Negative.   Neurological: Negative.  Negative for dizziness and headaches.  Endo/Heme/Allergies: Negative.   Psychiatric/Behavioral: Negative.  Negative for depression and suicidal ideas. The patient is not nervous/anxious.        Objective:   BP 130/86   Pulse (!) 102   Ht 5'  11" (1.803 m)   Wt 297 lb 12.8 oz (135.1 kg)   SpO2 99%   BMI 41.53 kg/m   Vitals:   04/25/23 0952  BP: 130/86  Pulse: (!) 102  Height: 5\' 11"  (1.803 m)  Weight: 297 lb 12.8 oz (135.1 kg)  SpO2: 99%  BMI (Calculated): 41.55    Physical Exam Vitals and nursing note reviewed.  Constitutional:      General: He is not in acute distress.    Appearance: Normal appearance.  HENT:     Head: Normocephalic and atraumatic.     Nose: Nose normal.     Mouth/Throat:     Mouth: Mucous membranes are moist.     Pharynx: Oropharynx is clear.  Eyes:     Conjunctiva/sclera: Conjunctivae normal.     Pupils: Pupils are  equal, round, and reactive to light.  Cardiovascular:     Rate and Rhythm: Normal rate and regular rhythm.     Pulses: Normal pulses.     Heart sounds: Normal heart sounds.  Pulmonary:     Effort: Pulmonary effort is normal.     Breath sounds: Normal breath sounds. No wheezing, rhonchi or rales.  Abdominal:     General: Bowel sounds are normal.     Palpations: Abdomen is soft. There is no mass.     Tenderness: There is no abdominal tenderness. There is no right CVA tenderness, left CVA tenderness, guarding or rebound.     Hernia: No hernia is present.  Musculoskeletal:        General: Normal range of motion.     Cervical back: Normal range of motion.     Right lower leg: No edema.     Left lower leg: No edema.  Skin:    General: Skin is warm and dry.     Findings: No rash.  Neurological:     General: No focal deficit present.     Mental Status: He is alert and oriented to person, place, and time.  Psychiatric:        Mood and Affect: Mood normal.        Behavior: Behavior normal.        Judgment: Judgment normal.      No results found for any visits on 04/25/23.  No results found for this or any previous visit (from the past 2160 hour(s)).    Assessment & Plan:  Suspect diarrhea of infectious origin. 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 Will empirically treat with p.o. Cipro and Flagyl for now. Check blood work today. May need stool cultures if not better. Problem List Items Addressed This Visit   None Visit Diagnoses     Gastroenteritis    -  Primary   Relevant Medications   ciprofloxacin (CIPRO) 500 MG tablet   metroNIDAZOLE (FLAGYL) 500 MG tablet   Other Relevant Orders   CBC with Diff   CMP14+EGFR   Lipase   Diarrhea of infectious origin       Relevant Medications   metroNIDAZOLE (FLAGYL) 500 MG tablet       Return in about 1 week (around 05/02/2023).   Total time spent: 30 minutes  Margaretann Loveless, MD  04/25/2023   This document may have  been prepared by [facility] Voice Recognition software and as such may include unintentional dictation errors.

## 2023-04-26 LAB — CBC WITH DIFFERENTIAL/PLATELET
Basophils Absolute: 0.1 10*3/uL (ref 0.0–0.2)
Basos: 1 %
EOS (ABSOLUTE): 0.3 10*3/uL (ref 0.0–0.4)
Eos: 4 %
Hematocrit: 45.1 % (ref 37.5–51.0)
Hemoglobin: 14.2 g/dL (ref 13.0–17.7)
Immature Grans (Abs): 0.1 10*3/uL (ref 0.0–0.1)
Immature Granulocytes: 1 %
Lymphocytes Absolute: 1.5 10*3/uL (ref 0.7–3.1)
Lymphs: 21 %
MCH: 26.2 pg — ABNORMAL LOW (ref 26.6–33.0)
MCHC: 31.5 g/dL (ref 31.5–35.7)
MCV: 83 fL (ref 79–97)
Monocytes Absolute: 0.7 10*3/uL (ref 0.1–0.9)
Monocytes: 10 %
Neutrophils Absolute: 4.4 10*3/uL (ref 1.4–7.0)
Neutrophils: 63 %
Platelets: 250 10*3/uL (ref 150–450)
RBC: 5.43 x10E6/uL (ref 4.14–5.80)
RDW: 13 % (ref 11.6–15.4)
WBC: 6.9 10*3/uL (ref 3.4–10.8)

## 2023-04-26 LAB — CMP14+EGFR
ALT: 18 [IU]/L (ref 0–44)
AST: 22 [IU]/L (ref 0–40)
Albumin: 4.2 g/dL (ref 4.1–5.1)
Alkaline Phosphatase: 75 [IU]/L (ref 44–121)
BUN/Creatinine Ratio: 9 (ref 9–20)
BUN: 12 mg/dL (ref 6–20)
Bilirubin Total: 0.4 mg/dL (ref 0.0–1.2)
CO2: 23 mmol/L (ref 20–29)
Calcium: 9.2 mg/dL (ref 8.7–10.2)
Chloride: 105 mmol/L (ref 96–106)
Creatinine, Ser: 1.41 mg/dL — ABNORMAL HIGH (ref 0.76–1.27)
Globulin, Total: 2.5 g/dL (ref 1.5–4.5)
Glucose: 78 mg/dL (ref 70–99)
Potassium: 4.6 mmol/L (ref 3.5–5.2)
Sodium: 142 mmol/L (ref 134–144)
Total Protein: 6.7 g/dL (ref 6.0–8.5)
eGFR: 65 mL/min/{1.73_m2} (ref >59)

## 2023-04-26 LAB — LIPASE: Lipase: 36 U/L (ref 13–78)

## 2023-04-27 NOTE — Progress Notes (Signed)
Patient notified

## 2023-05-02 ENCOUNTER — Encounter: Payer: Self-pay | Admitting: Internal Medicine

## 2023-05-02 ENCOUNTER — Ambulatory Visit (INDEPENDENT_AMBULATORY_CARE_PROVIDER_SITE_OTHER): Payer: BC Managed Care – PPO | Admitting: Internal Medicine

## 2023-05-02 VITALS — BP 112/78 | HR 96 | Ht 71.0 in | Wt 299.2 lb

## 2023-05-02 DIAGNOSIS — Z0130 Encounter for examination of blood pressure without abnormal findings: Secondary | ICD-10-CM

## 2023-05-02 DIAGNOSIS — A09 Infectious gastroenteritis and colitis, unspecified: Secondary | ICD-10-CM | POA: Diagnosis not present

## 2023-05-02 DIAGNOSIS — F411 Generalized anxiety disorder: Secondary | ICD-10-CM

## 2023-05-02 NOTE — Progress Notes (Signed)
Established Patient Office Visit  Subjective:  Patient ID: Caleb Jacobson, male    DOB: May 15, 1986  Age: 37 y.o. MRN: [id number]  Chief Complaint  Patient presents with   Follow-up    1 week follow up    Patient comes in for follow-up of his diarrhea.  He has completed Cipro and Flagyl and his symptoms have resolved almost completely.  He is not having any loose runny stool but his bowel movement is still soft to loose.  Advised to start taking an OTC probiotic.  No nausea vomiting and no fevers and chills.  He has gained some of the weight back. Needs his regular follow-up with Dr. Margo Aye in one month.    No other concerns at this time.   Past Medical History:  Diagnosis Date   Anxiety    Hypertension    Sleep apnea     Past Surgical History:  Procedure Laterality Date   HERNIA REPAIR  2010   REPLACEMENT DISC ANTERIOR LUMBAR SPINE  2003   UVULOPALATOPHARYNGOPLASTY  2014    Social History   Socioeconomic History   Marital status: Married    Spouse name: Not on file   Number of children: Not on file   Years of education: Not on file   Highest education level: Not on file  Occupational History   Not on file  Tobacco Use   Smoking status: Never   Smokeless tobacco: Never  Substance and Sexual Activity   Alcohol use: Yes    Alcohol/week: 0.0 standard drinks of alcohol   Drug use: Never   Sexual activity: Not on file  Other Topics Concern   Not on file  Social History Narrative   Not on file   Social Determinants of Health   Financial Resource Strain: Not on file  Food Insecurity: Not on file  Transportation Needs: Not on file  Physical Activity: Not on file  Stress: Not on file  Social Connections: Not on file  Intimate Partner Violence: Not on file    History reviewed. No pertinent family history.  No Known Allergies  Review of Systems  Constitutional: Negative.  Negative for chills, diaphoresis, fever, malaise/fatigue and weight loss.  HENT: Negative.     Eyes: Negative.   Respiratory: Negative.  Negative for cough and shortness of breath.   Cardiovascular: Negative.  Negative for chest pain, palpitations and leg swelling.  Gastrointestinal: Negative.  Negative for abdominal pain, blood in stool, constipation, diarrhea, heartburn, melena, nausea and vomiting.  Genitourinary: Negative.  Negative for dysuria and flank pain.  Musculoskeletal: Negative.  Negative for joint pain and myalgias.  Skin: Negative.   Neurological: Negative.  Negative for dizziness and headaches.  Endo/Heme/Allergies: Negative.   Psychiatric/Behavioral: Negative.  Negative for depression and suicidal ideas. The patient is not nervous/anxious.        Objective:   BP 112/78   Pulse 96   Ht 5\' 11"  (1.803 m)   Wt 299 lb 3.2 oz (135.7 kg)   SpO2 96%   BMI 41.73 kg/m   Vitals:   05/02/23 1005  BP: 112/78  Pulse: 96  Height: 5\' 11"  (1.803 m)  Weight: 299 lb 3.2 oz (135.7 kg)  SpO2: 96%  BMI (Calculated): 41.75    Physical Exam Vitals and nursing note reviewed.  Constitutional:      Appearance: Normal appearance.  HENT:     Head: Normocephalic and atraumatic.     Nose: Nose normal.     Mouth/Throat:  Mouth: Mucous membranes are moist.     Pharynx: Oropharynx is clear.  Eyes:     Conjunctiva/sclera: Conjunctivae normal.     Pupils: Pupils are equal, round, and reactive to light.  Cardiovascular:     Rate and Rhythm: Normal rate and regular rhythm.     Pulses: Normal pulses.     Heart sounds: Normal heart sounds.  Pulmonary:     Effort: Pulmonary effort is normal.     Breath sounds: Normal breath sounds.  Abdominal:     General: Bowel sounds are normal.     Palpations: Abdomen is soft.  Musculoskeletal:        General: Normal range of motion.     Cervical back: Normal range of motion.  Skin:    General: Skin is warm and dry.  Neurological:     General: No focal deficit present.     Mental Status: He is alert and oriented to person,  place, and time.  Psychiatric:        Mood and Affect: Mood normal.        Behavior: Behavior normal.        Judgment: Judgment normal.      No results found for any visits on 05/02/23.  Recent Results (from the past 2160 hour(s))  CBC with Diff     Status: Abnormal   Collection Time: 04/25/23 10:16 AM  Result Value Ref Range   WBC 6.9 3.4 - 10.8 x10E3/uL   RBC 5.43 4.14 - 5.80 x10E6/uL   Hemoglobin 14.2 13.0 - 17.7 g/dL   Hematocrit 52.8 41.3 - 51.0 %   MCV 83 79 - 97 fL   MCH 26.2 (L) 26.6 - 33.0 pg   MCHC 31.5 31.5 - 35.7 g/dL   RDW 24.4 01.0 - 27.2 %   Platelets 250 150 - 450 x10E3/uL   Neutrophils 63 Not Estab. %   Lymphs 21 Not Estab. %   Monocytes 10 Not Estab. %   Eos 4 Not Estab. %   Basos 1 Not Estab. %   Neutrophils Absolute 4.4 1.4 - 7.0 x10E3/uL   Lymphocytes Absolute 1.5 0.7 - 3.1 x10E3/uL   Monocytes Absolute 0.7 0.1 - 0.9 x10E3/uL   EOS (ABSOLUTE) 0.3 0.0 - 0.4 x10E3/uL   Basophils Absolute 0.1 0.0 - 0.2 x10E3/uL   Immature Granulocytes 1 Not Estab. %   Immature Grans (Abs) 0.1 0.0 - 0.1 x10E3/uL  CMP14+EGFR     Status: Abnormal   Collection Time: 04/25/23 10:16 AM  Result Value Ref Range   Glucose 78 70 - 99 mg/dL   BUN 12 6 - 20 mg/dL   Creatinine, Ser 5.36 (H) 0.76 - 1.27 mg/dL   eGFR 65 >64 QI/HKV/4.25   BUN/Creatinine Ratio 9 9 - 20   Sodium 142 134 - 144 mmol/L   Potassium 4.6 3.5 - 5.2 mmol/L   Chloride 105 96 - 106 mmol/L   CO2 23 20 - 29 mmol/L   Calcium 9.2 8.7 - 10.2 mg/dL   Total Protein 6.7 6.0 - 8.5 g/dL   Albumin 4.2 4.1 - 5.1 g/dL   Globulin, Total 2.5 1.5 - 4.5 g/dL   Bilirubin Total 0.4 0.0 - 1.2 mg/dL   Alkaline Phosphatase 75 44 - 121 IU/L   AST 22 0 - 40 IU/L   ALT 18 0 - 44 IU/L  Lipase     Status: None   Collection Time: 04/25/23 10:16 AM  Result Value Ref Range   Lipase 36 13 -  78 U/L      Assessment & Plan:  Patient advised to continue drinking enough water.  Monitor his weight.  Follow-up with his PMD. Problem  List Items Addressed This Visit   None Visit Diagnoses     Diarrhea of infectious origin    -  Primary   GAD (generalized anxiety disorder)           Return in about 1 month (around 06/02/2023).   Total time spent: 25 minutes  Margaretann Loveless, MD  05/02/2023   This document may have been prepared by [facility] Voice Recognition software and as such may include unintentional dictation errors.

## 2023-05-26 ENCOUNTER — Encounter: Payer: Self-pay | Admitting: Cardiovascular Disease

## 2023-05-26 ENCOUNTER — Ambulatory Visit (INDEPENDENT_AMBULATORY_CARE_PROVIDER_SITE_OTHER): Payer: BC Managed Care – PPO | Admitting: Cardiovascular Disease

## 2023-05-26 VITALS — BP 118/89 | HR 97 | Ht 71.0 in | Wt 300.0 lb

## 2023-05-26 DIAGNOSIS — I4711 Inappropriate sinus tachycardia, so stated: Secondary | ICD-10-CM

## 2023-05-26 DIAGNOSIS — R0602 Shortness of breath: Secondary | ICD-10-CM

## 2023-05-26 DIAGNOSIS — I340 Nonrheumatic mitral (valve) insufficiency: Secondary | ICD-10-CM

## 2023-05-26 DIAGNOSIS — I10 Essential (primary) hypertension: Secondary | ICD-10-CM | POA: Diagnosis not present

## 2023-05-26 DIAGNOSIS — G4730 Sleep apnea, unspecified: Secondary | ICD-10-CM | POA: Diagnosis not present

## 2023-05-26 NOTE — Progress Notes (Signed)
Cardiology Office Note   Date:  05/26/2023   ID:  Caleb Jacobson, DOB 12/01/1985, MRN [id number]  PCP:  Caleb Monday, MD  Cardiologist:  Caleb Blackwater, MD      History of Present Illness: Caleb Jacobson is a 37 y.o. male who presents for  Chief Complaint  Patient presents with   Follow-up    Doing well      Past Medical History:  Diagnosis Date   Anxiety    Hypertension    Sleep apnea      Past Surgical History:  Procedure Laterality Date   HERNIA REPAIR  2010   REPLACEMENT DISC ANTERIOR LUMBAR SPINE  2003   UVULOPALATOPHARYNGOPLASTY  2014     Current Outpatient Medications  Medication Sig Dispense Refill   buPROPion (WELLBUTRIN XL) 300 MG 24 hr tablet Take 300 mg by mouth daily.     lamoTRIgine (LAMICTAL) 25 MG tablet Take 25 mg by mouth 2 (two) times daily.     losartan (COZAAR) 25 MG tablet TAKE 1 TABLET BY MOUTH EVERY DAY 90 tablet 0   metoprolol succinate (TOPROL-XL) 50 MG 24 hr tablet TAKE 1 TABLET BY MOUTH EVERY DAY. MAKE. FOLLOW UP APPOINTMENT WITH DOCTOR Habib Kise FOR REFILLS** 90 tablet 3   spironolactone (ALDACTONE) 25 MG tablet Take 1 tablet (25 mg total) by mouth daily. 30 tablet 11   No current facility-administered medications for this visit.    Allergies:   Patient has no known allergies.    Social History:   reports that he has never smoked. He has never used smokeless tobacco. He reports current alcohol use. He reports that he does not use drugs.   Family History:  family history is not on file.    ROS:     Review of Systems  Constitutional: Negative.   HENT: Negative.    Eyes: Negative.   Respiratory: Negative.    Gastrointestinal: Negative.   Genitourinary: Negative.   Musculoskeletal: Negative.   Skin: Negative.   Neurological: Negative.   Endo/Heme/Allergies: Negative.   Psychiatric/Behavioral: Negative.    All other systems reviewed and are negative.     All other systems are reviewed and negative.    PHYSICAL  EXAM: VS:  BP 118/89   Pulse 97   Ht 5\' 11"  (1.803 m)   Wt 300 lb (136.1 kg)   SpO2 100%   BMI 41.84 kg/m  , BMI Body mass index is 41.84 kg/m. Last weight:  Wt Readings from Last 3 Encounters:  05/26/23 300 lb (136.1 kg)  05/02/23 299 lb 3.2 oz (135.7 kg)  04/25/23 297 lb 12.8 oz (135.1 kg)     Physical Exam Vitals reviewed.  Constitutional:      Appearance: Normal appearance. He is normal weight.  HENT:     Head: Normocephalic.     Nose: Nose normal.     Mouth/Throat:     Mouth: Mucous membranes are moist.  Eyes:     Pupils: Pupils are equal, round, and reactive to light.  Cardiovascular:     Rate and Rhythm: Normal rate and regular rhythm.     Pulses: Normal pulses.     Heart sounds: Normal heart sounds.  Pulmonary:     Effort: Pulmonary effort is normal.  Abdominal:     General: Abdomen is flat. Bowel sounds are normal.  Musculoskeletal:        General: Normal range of motion.     Cervical back: Normal range of motion.  Skin:  General: Skin is warm.  Neurological:     General: No focal deficit present.     Mental Status: He is alert.  Psychiatric:        Mood and Affect: Mood normal.       EKG:   Recent Labs: 04/25/2023: ALT 18; BUN 12; Creatinine, Ser 1.41; Hemoglobin 14.2; Platelets 250; Potassium 4.6; Sodium 142    Lipid Panel No results found for: "CHOL", "TRIG", "HDL", "CHOLHDL", "VLDL", "LDLCALC", "LDLDIRECT"    Other studies Reviewed: Additional studies/ records that were reviewed today include:  Review of the above records demonstrates:       No data to display            ASSESSMENT AND PLAN:    ICD-10-CM   1. 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 SOB (shortness of breath)  R06.02    Better with aldactone    3. Nonrheumatic mitral valve regurgitation  I34.0     4. Primary hypertension  I10     5. Sleep apnea, unspecified type  G47.30    Non compliant  with cpap       Problem List  Items Addressed This Visit       Cardiovascular and Mediastinum   Inappropriate sinus tachycardia (HCC) - Primary   Other Visit Diagnoses     SOB (shortness of breath)       Better with aldactone   Nonrheumatic mitral valve regurgitation       Primary hypertension       Sleep apnea, unspecified type       Non compliant  with cpap          Disposition:   Return in about 3 months (around 08/26/2023).    Total time spent: 30 minutes  Signed,  Caleb Blackwater, MD  05/26/2023 9:43 AM    [facility]

## 2023-06-07 ENCOUNTER — Encounter: Payer: Self-pay | Admitting: Internal Medicine

## 2023-06-07 ENCOUNTER — Ambulatory Visit (INDEPENDENT_AMBULATORY_CARE_PROVIDER_SITE_OTHER): Payer: BC Managed Care – PPO | Admitting: Internal Medicine

## 2023-06-07 VITALS — BP 135/79 | HR 105 | Ht 71.0 in | Wt 306.2 lb

## 2023-06-07 DIAGNOSIS — F411 Generalized anxiety disorder: Secondary | ICD-10-CM | POA: Insufficient documentation

## 2023-06-07 DIAGNOSIS — I10 Essential (primary) hypertension: Secondary | ICD-10-CM | POA: Diagnosis not present

## 2023-06-07 MED ORDER — WEGOVY 0.25 MG/0.5ML ~~LOC~~ SOAJ: 0.2500 mg | SUBCUTANEOUS | 0 refills | Status: AC

## 2023-06-07 NOTE — Progress Notes (Signed)
Established Patient Office Visit  Subjective:  Patient ID: Caleb Jacobson, male    DOB: 09-23-1985  Age: 37 y.o. MRN: [id number]  Chief Complaint  Patient presents with   Follow-up    No new complaints, here for lab review and medication refills. Diarrhea has resolved.   No other concerns at this time.   Past Medical History:  Diagnosis Date   Anxiety    Hypertension    Sleep apnea     Past Surgical History:  Procedure Laterality Date   HERNIA REPAIR  2010   REPLACEMENT DISC ANTERIOR LUMBAR SPINE  2003   UVULOPALATOPHARYNGOPLASTY  2014    Social History   Socioeconomic History   Marital status: Married    Spouse name: Not on file   Number of children: Not on file   Years of education: Not on file   Highest education level: Not on file  Occupational History   Not on file  Tobacco Use   Smoking status: Never   Smokeless tobacco: Never  Substance and Sexual Activity   Alcohol use: Yes    Alcohol/week: 0.0 standard drinks of alcohol   Drug use: Never   Sexual activity: Not on file  Other Topics Concern   Not on file  Social History Narrative   Not on file   Social Determinants of Health   Financial Resource Strain: Not on file  Food Insecurity: Not on file  Transportation Needs: Not on file  Physical Activity: Not on file  Stress: Not on file  Social Connections: Not on file  Intimate Partner Violence: Not on file    No family history on file.  No Known Allergies  Outpatient Medications Prior to Visit  Medication Sig   buPROPion (WELLBUTRIN XL) 300 MG 24 hr tablet Take 300 mg by mouth daily.   lamoTRIgine (LAMICTAL) 25 MG tablet Take 25 mg by mouth 2 (two) times daily.   losartan (COZAAR) 25 MG tablet TAKE 1 TABLET BY MOUTH EVERY DAY   metoprolol succinate (TOPROL-XL) 50 MG 24 hr tablet TAKE 1 TABLET BY MOUTH EVERY DAY. MAKE. FOLLOW UP APPOINTMENT WITH DOCTOR KHAN FOR REFILLS**   spironolactone (ALDACTONE) 25 MG tablet Take 1 tablet (25 mg total)  by mouth daily.   No facility-administered medications prior to visit.    Review of Systems  Psychiatric/Behavioral:  Positive for depression.        Objective:   BP 135/79   Pulse (!) 105   Ht 5\' 11"  (1.803 m)   Wt (!) 306 lb 3.2 oz (138.9 kg)   SpO2 98%   BMI 42.71 kg/m   Vitals:   06/07/23 1033  BP: 135/79  Pulse: (!) 105  Height: 5\' 11"  (1.803 m)  Weight: (!) 306 lb 3.2 oz (138.9 kg)  SpO2: 98%  BMI (Calculated): 42.73    Physical Exam Vitals reviewed.  Constitutional:      Appearance: Normal appearance. He is obese.  HENT:     Head: Normocephalic.     Left Ear: There is no impacted cerumen.     Nose: Nose normal.     Mouth/Throat:     Mouth: Mucous membranes are moist.     Pharynx: No posterior oropharyngeal erythema.  Eyes:     Extraocular Movements: Extraocular movements intact.     Pupils: Pupils are equal, round, and reactive to light.  Cardiovascular:     Rate and Rhythm: Regular rhythm.     Chest Wall: PMI is not displaced.  Pulses: Normal pulses.     Heart sounds: Normal heart sounds. No murmur heard. Pulmonary:     Effort: Pulmonary effort is normal.     Breath sounds: Normal air entry. No rhonchi or rales.  Abdominal:     General: Abdomen is flat. Bowel sounds are normal. There is no distension.     Palpations: Abdomen is soft. There is no hepatomegaly, splenomegaly or mass.     Tenderness: There is no abdominal tenderness.  Musculoskeletal:        General: Normal range of motion.     Cervical back: Normal range of motion and neck supple.     Right lower leg: No edema.     Left lower leg: No edema.  Skin:    General: Skin is warm and dry.  Neurological:     General: No focal deficit present.     Mental Status: He is alert and oriented to person, place, and time.     Cranial Nerves: No cranial nerve deficit.     Motor: No weakness.  Psychiatric:        Mood and Affect: Mood normal.        Behavior: Behavior normal.      No  results found for any visits on 06/07/23.      Assessment & Plan:  As per problem list  Problem List Items Addressed This Visit       Cardiovascular and Mediastinum   Primary hypertension - Primary     Other   Morbid obesity (HCC)   Relevant Medications   Semaglutide-Weight Management (WEGOVY) 0.25 MG/0.5ML SOAJ   Other Relevant Orders   Comprehensive metabolic panel   Lipid panel   Hemoglobin A1c   CBC With Diff/Platelet   GAD (generalized anxiety disorder)    Return in about 1 month (around 07/07/2023) for Weight management, cpe with labs prior.   Total time spent: 20 minutes  Luna Fuse, MD  06/07/2023   This document may have been prepared by [facility] Voice Recognition software and as such may include unintentional dictation errors.

## 2023-06-08 ENCOUNTER — Telehealth: Payer: Self-pay | Admitting: Internal Medicine

## 2023-06-08 NOTE — Telephone Encounter (Signed)
Error

## 2023-07-18 ENCOUNTER — Encounter: Payer: BC Managed Care – PPO | Admitting: Internal Medicine

## 2023-08-26 ENCOUNTER — Ambulatory Visit: Payer: BC Managed Care – PPO | Admitting: Cardiovascular Disease

## 2023-10-11 ENCOUNTER — Other Ambulatory Visit: Payer: Self-pay | Admitting: Cardiovascular Disease

## 2023-10-11 DIAGNOSIS — I10 Essential (primary) hypertension: Secondary | ICD-10-CM

## 2023-12-02 ENCOUNTER — Encounter: Payer: Self-pay | Admitting: Cardiology

## 2023-12-02 ENCOUNTER — Ambulatory Visit: Payer: PRIVATE HEALTH INSURANCE | Admitting: Cardiology

## 2023-12-02 VITALS — BP 112/64 | HR 109 | Ht 71.0 in | Wt 312.0 lb

## 2023-12-02 DIAGNOSIS — B029 Zoster without complications: Secondary | ICD-10-CM | POA: Insufficient documentation

## 2023-12-02 DIAGNOSIS — Z0130 Encounter for examination of blood pressure without abnormal findings: Secondary | ICD-10-CM

## 2023-12-02 MED ORDER — VALACYCLOVIR HCL 1 G PO TABS: 1000.0000 mg | ORAL_TABLET | Freq: Three times a day (TID) | ORAL | 0 refills | Status: AC

## 2023-12-02 MED ORDER — GABAPENTIN 300 MG PO CAPS: 300.0000 mg | ORAL_CAPSULE | Freq: Every day | ORAL | 1 refills | Status: AC

## 2023-12-02 NOTE — Progress Notes (Signed)
 Established Patient Office Visit  Subjective:  Patient ID: Caleb Jacobson, male    DOB: [birth year]  Age: 38 y.o. MRN: [id number]  Chief Complaint  Patient presents with   Acute Visit    Rash on R Side Forearm into Armpit area    Patient in office for an acute visit, complaining of a rash on his right side forearm into axillary area. Patient reports rash started yesterday. Rash is unilateral, vesicular, in a dermatomal distribution with erythematous base. Rash is painful to touch. Patient had shingles in the past two years. Will send in valaciclovir, and gabapentin for post herpetic pain. Recommend getting a shingles vaccination in 3-4 months from the pharmacy. Patient reports girlfriend is pregnant, recommend isolating from her for at least one week. Girlfriend to reach out to her OB for any further recommendations.     No other concerns at this time.   Past Medical History:  Diagnosis Date   Anxiety    Hypertension    Sleep apnea     Past Surgical History:  Procedure Laterality Date   HERNIA REPAIR  2010   REPLACEMENT DISC ANTERIOR LUMBAR SPINE  2003   UVULOPALATOPHARYNGOPLASTY  2014    Social History   Socioeconomic History   Marital status: Married    Spouse name: Not on file   Number of children: Not on file   Years of education: Not on file   Highest education level: Not on file  Occupational History   Not on file  Tobacco Use   Smoking status: Never   Smokeless tobacco: Never  Substance and Sexual Activity   Alcohol use: Yes    Alcohol/week: 0.0 standard drinks of alcohol   Drug use: Never   Sexual activity: Not on file  Other Topics Concern   Not on file  Social History Narrative   Not on file   Social Drivers of Health   Financial Resource Strain: Not on file  Food Insecurity: Not on file  Transportation Needs: Not on file  Physical Activity: Not on file  Stress: Not on file  Social Connections: Not on file  Intimate Partner Violence: Not on file     History reviewed. No pertinent family history.  No Known Allergies  Outpatient Medications Prior to Visit  Medication Sig   losartan (COZAAR) 25 MG tablet TAKE 1 TABLET BY MOUTH EVERY DAY   metoprolol  succinate (TOPROL -XL) 50 MG 24 hr tablet TAKE 1 TABLET BY MOUTH EVERY DAY. MAKE. FOLLOW UP APPOINTMENT WITH DOCTOR KHAN FOR REFILLS**   spironolactone  (ALDACTONE ) 25 MG tablet Take 1 tablet (25 mg total) by mouth daily.   [DISCONTINUED] buPROPion (WELLBUTRIN XL) 300 MG 24 hr tablet Take 300 mg by mouth daily. (Patient not taking: Reported on 12/02/2023)   [DISCONTINUED] lamoTRIgine (LAMICTAL) 25 MG tablet Take 25 mg by mouth 2 (two) times daily. (Patient not taking: Reported on 12/02/2023)   No facility-administered medications prior to visit.    ROS     Objective:   BP 112/64   Pulse (!) 109   Ht 5\' 11"  (1.803 m)   Wt (!) 312 lb (141.5 kg)   SpO2 97%   BMI 43.52 kg/m   Vitals:   12/02/23 1012  BP: 112/64  Pulse: (!) 109  Height: 5\' 11"  (1.803 m)  Weight: (!) 312 lb (141.5 kg)  SpO2: 97%  BMI (Calculated): 43.53    Physical Exam   No results found for any visits on 12/02/23.  No results found for this  or any previous visit (from the past 2160 hours).    Assessment & Plan:  Valacyclovir Gabapentin   Problem List Items Addressed This Visit       Nervous and Auditory   Herpes zoster without complication - Primary   Relevant Medications   valACYclovir (VALTREX) 1000 MG tablet    Return if symptoms worsen or fail to improve.   Total time spent: 25 minutes  [employer], NP  12/02/2023   This document may have been prepared by [employer] Voice Recognition software and as such may include unintentional dictation errors.

## 2024-01-27 ENCOUNTER — Other Ambulatory Visit: Payer: Self-pay | Admitting: Cardiovascular Disease

## 2024-01-27 DIAGNOSIS — I10 Essential (primary) hypertension: Secondary | ICD-10-CM

## 2024-03-21 ENCOUNTER — Telehealth: Payer: Self-pay | Admitting: Internal Medicine

## 2024-03-21 NOTE — Telephone Encounter (Signed)
 Pt just recently got diagnosed with covid and wants to know if he could have PAXLOVID  sent to the [facility] in [place] [address] [place], [place] [address]

## 2024-03-22 ENCOUNTER — Encounter: Payer: Self-pay | Admitting: Internal Medicine

## 2024-03-22 ENCOUNTER — Telehealth: Payer: Self-pay | Admitting: Internal Medicine

## 2024-03-22 NOTE — Telephone Encounter (Signed)
 See other msg plus mychart messages , pt also LVM stating the same thing at 1:30 today

## 2024-03-22 NOTE — Telephone Encounter (Signed)
 See the message from the mychart with the positive test, it was Tuesday AM

## 2024-03-22 NOTE — Telephone Encounter (Signed)
 Pt tested positivie for covid Tuesday morning & would like some paxlovid  sent to his pharm, pt will be sending a picture of the test

## 2024-03-23 ENCOUNTER — Other Ambulatory Visit: Payer: Self-pay | Admitting: Internal Medicine

## 2024-03-23 MED ORDER — PAXLOVID (300/100) 20 X 150 MG & 10 X 100MG PO TBPK: ORAL_TABLET | ORAL | 0 refills | Status: AC

## 2024-05-11 ENCOUNTER — Other Ambulatory Visit: Payer: Self-pay

## 2024-05-11 DIAGNOSIS — I10 Essential (primary) hypertension: Secondary | ICD-10-CM

## 2024-05-14 MED ORDER — LOSARTAN POTASSIUM 25 MG PO TABS: 25.0000 mg | ORAL_TABLET | Freq: Every day | ORAL | 0 refills | Status: DC

## 2024-07-06 ENCOUNTER — Other Ambulatory Visit: Payer: Self-pay | Admitting: Internal Medicine

## 2024-07-06 DIAGNOSIS — I10 Essential (primary) hypertension: Secondary | ICD-10-CM

## 2024-07-06 NOTE — Telephone Encounter (Signed)
 Left a VM

## 2024-08-07 ENCOUNTER — Other Ambulatory Visit: Payer: Self-pay

## 2024-08-07 DIAGNOSIS — I10 Essential (primary) hypertension: Secondary | ICD-10-CM

## 2024-08-07 DIAGNOSIS — R0602 Shortness of breath: Secondary | ICD-10-CM

## 2024-08-07 DIAGNOSIS — I340 Nonrheumatic mitral (valve) insufficiency: Secondary | ICD-10-CM

## 2024-08-07 DIAGNOSIS — I4711 Inappropriate sinus tachycardia, so stated: Secondary | ICD-10-CM

## 2024-08-07 MED ORDER — SPIRONOLACTONE 25 MG PO TABS: 25.0000 mg | ORAL_TABLET | Freq: Every day | ORAL | 0 refills | Status: AC

## 2024-08-07 MED ORDER — METOPROLOL SUCCINATE ER 50 MG PO TB24: 50.0000 mg | ORAL_TABLET | Freq: Every day | ORAL | 0 refills | Status: AC

## 2024-08-08 ENCOUNTER — Other Ambulatory Visit: Payer: Self-pay | Admitting: Cardiovascular Disease

## 2024-08-08 ENCOUNTER — Other Ambulatory Visit: Payer: Self-pay | Admitting: Internal Medicine

## 2024-08-08 DIAGNOSIS — I10 Essential (primary) hypertension: Secondary | ICD-10-CM
